# Patient Record
Sex: Male | Born: 1985 | Race: Black or African American | Hispanic: No | Marital: Single | Smoking: Current some day smoker
Health system: Southern US, Community
[De-identification: ages and names within clinical notes are randomized; demographics above are authoritative.]

## PROBLEM LIST (undated history)

## (undated) DIAGNOSIS — R569 Unspecified convulsions: Secondary | ICD-10-CM

## (undated) DIAGNOSIS — I10 Essential (primary) hypertension: Secondary | ICD-10-CM

---

## 2019-10-19 ENCOUNTER — Other Ambulatory Visit: Payer: Self-pay

## 2019-10-19 DIAGNOSIS — Z20822 Contact with and (suspected) exposure to covid-19: Secondary | ICD-10-CM

## 2019-10-22 LAB — NOVEL CORONAVIRUS, NAA: SARS-CoV-2, NAA: NOT DETECTED

## 2019-10-24 ENCOUNTER — Telehealth: Payer: Self-pay

## 2019-10-24 NOTE — Telephone Encounter (Signed)
Patient informed of negative covid result. Patient verbalized understanding.   

## 2020-03-03 ENCOUNTER — Emergency Department
Admission: EM | Admit: 2020-03-03 | Discharge: 2020-03-03 | Disposition: A | Discharge status: Home / Self Care | Payer: Self-pay | Attending: Emergency Medicine | Admitting: Emergency Medicine

## 2020-03-03 ENCOUNTER — Encounter: Payer: Self-pay | Admitting: Emergency Medicine

## 2020-03-03 ENCOUNTER — Other Ambulatory Visit: Payer: Self-pay

## 2020-03-03 DIAGNOSIS — I10 Essential (primary) hypertension: Secondary | ICD-10-CM | POA: Insufficient documentation

## 2020-03-03 DIAGNOSIS — F1310 Sedative, hypnotic or anxiolytic abuse, uncomplicated: Secondary | ICD-10-CM | POA: Insufficient documentation

## 2020-03-03 DIAGNOSIS — F1510 Other stimulant abuse, uncomplicated: Secondary | ICD-10-CM | POA: Insufficient documentation

## 2020-03-03 DIAGNOSIS — F1210 Cannabis abuse, uncomplicated: Secondary | ICD-10-CM | POA: Insufficient documentation

## 2020-03-03 DIAGNOSIS — F17200 Nicotine dependence, unspecified, uncomplicated: Secondary | ICD-10-CM | POA: Insufficient documentation

## 2020-03-03 DIAGNOSIS — F1410 Cocaine abuse, uncomplicated: Secondary | ICD-10-CM | POA: Insufficient documentation

## 2020-03-03 DIAGNOSIS — R569 Unspecified convulsions: Secondary | ICD-10-CM | POA: Insufficient documentation

## 2020-03-03 HISTORY — DX: Unspecified convulsions: R56.9

## 2020-03-03 HISTORY — DX: Essential (primary) hypertension: I10

## 2020-03-03 LAB — CBC
HCT: 42.4 % (ref 39.0–52.0)
Hemoglobin: 14.2 g/dL (ref 13.0–17.0)
MCH: 33.1 pg (ref 26.0–34.0)
MCHC: 33.5 g/dL (ref 30.0–36.0)
MCV: 98.8 fL (ref 80.0–100.0)
Platelets: 186 10*3/uL (ref 150–400)
RBC: 4.29 MIL/uL (ref 4.22–5.81)
RDW: 12.6 % (ref 11.5–15.5)
WBC: 10.4 10*3/uL (ref 4.0–10.5)
nRBC: 0 % (ref 0.0–0.2)

## 2020-03-03 LAB — URINE DRUG SCREEN, QUALITATIVE (ARMC ONLY)
Amphetamines, Ur Screen: POSITIVE — AB
Barbiturates, Ur Screen: NOT DETECTED
Benzodiazepine, Ur Scrn: POSITIVE — AB
Cannabinoid 50 Ng, Ur ~~LOC~~: POSITIVE — AB
Cocaine Metabolite,Ur ~~LOC~~: POSITIVE — AB
MDMA (Ecstasy)Ur Screen: NOT DETECTED
Methadone Scn, Ur: NOT DETECTED
Opiate, Ur Screen: NOT DETECTED
Phencyclidine (PCP) Ur S: NOT DETECTED
Tricyclic, Ur Screen: NOT DETECTED

## 2020-03-03 LAB — COMPREHENSIVE METABOLIC PANEL
ALT: 13 U/L (ref 0–44)
AST: 21 U/L (ref 15–41)
Albumin: 4.1 g/dL (ref 3.5–5.0)
Alkaline Phosphatase: 33 U/L — ABNORMAL LOW (ref 38–126)
Anion gap: 7 (ref 5–15)
BUN: 9 mg/dL (ref 6–20)
CO2: 28 mmol/L (ref 22–32)
Calcium: 9.1 mg/dL (ref 8.9–10.3)
Chloride: 104 mmol/L (ref 98–111)
Creatinine, Ser: 1.24 mg/dL (ref 0.61–1.24)
GFR calc Af Amer: >60 mL/min (ref >60)
GFR calc non Af Amer: >60 mL/min (ref >60)
Glucose, Bld: 109 mg/dL — ABNORMAL HIGH (ref 70–99)
Potassium: 4.2 mmol/L (ref 3.5–5.1)
Sodium: 139 mmol/L (ref 135–145)
Total Bilirubin: 1.1 mg/dL (ref 0.3–1.2)
Total Protein: 7 g/dL (ref 6.5–8.1)

## 2020-03-03 LAB — ETHANOL: Alcohol, Ethyl (B): <10 mg/dL (ref <10)

## 2020-03-03 MED ORDER — LEVETIRACETAM 500 MG PO TABS: 500.0000 mg | ORAL_TABLET | Freq: Two times a day (BID) | ORAL | 1 refills | Status: DC

## 2020-03-03 NOTE — ED Provider Notes (Signed)
[facility] Emergency Department Provider Note  Time seen: 12:06 PM  I have reviewed the triage vital signs and the nursing notes.   HISTORY  Chief Complaint Seizures   HPI Kyle Shelton is a 34 y.o. male with a past medical history of hypertension, seizure disorder, presents emergency department after an apparent seizure at home.   According to the patient has a history of seizures however he states his last seizure was many years ago, was on medication for seizures but states he stopped taking it because he was not having any seizures.  Patient states occasional fairly rare alcohol use maybe 1-2 times per week.  Denies any substance use.  Patient initially postictal per EMS, now is awake alert answering questions appropriately following commands.  Largely negative review of systems.  Past Medical History:  Diagnosis Date  . Hypertension   . Seizures ([place])     There are no problems to display for this patient.   History reviewed. No pertinent surgical history.  Prior to Admission medications   Not on File    No Known Allergies  History reviewed. No pertinent family history.  Social History Social History   Tobacco Use  . Smoking status: Current Some Day Smoker  Substance Use Topics  . Alcohol use: Yes  . Drug use: Yes    Types: Marijuana    Review of Systems Constitutional: Negative for fever. Cardiovascular: Negative for chest pain. Respiratory: Negative for shortness of breath. Gastrointestinal: Negative for abdominal pain Musculoskeletal: Negative for musculoskeletal complaints Neurological: Negative for headache All other ROS negative  ____________________________________________   PHYSICAL EXAM:  VITAL SIGNS: ED Triage Vitals  Enc Vitals Group     BP 03/03/20 1130 (!) 148/89     Pulse Rate 03/03/20 1130 87     Resp 03/03/20 1130 (!) 21     Temp 03/03/20 1130 98 F (36.7 C)     Temp Source 03/03/20 1130 Oral     SpO2  03/03/20 1130 99 %     Weight 03/03/20 1131 210 lb (95.3 kg)     Height 03/03/20 1131 6\' 2"  (1.88 m)     Head Circumference --      Peak Flow --      Pain Score 03/03/20 1131 0     Pain Loc --      Pain Edu? --      Excl. in [place]? --     Constitutional: Alert and oriented. Well appearing and in no distress. Eyes: Normal exam ENT      Head: Normocephalic and atraumatic.      Mouth/Throat: Mucous membranes are moist.  Small right-sided tongue abrasion Cardiovascular: Normal rate, regular rhythm. No murmur Respiratory: Normal respiratory effort without tachypnea nor retractions. Breath sounds are clear  Gastrointestinal: Soft and nontender. No distention.   Musculoskeletal: Nontender with normal range of motion in all extremities.  Neurologic:  Normal speech and language. No gross focal neurologic deficits  Skin:  Skin is warm, dry and intact.  Psychiatric: Mood and affect are normal.   ____________________________________________    EKG  EKG viewed and interpreted by myself shows a normal sinus rhythm at 91 bpm with a narrow QRS, normal axis, normal intervals, no concerning ST changes.    INITIAL IMPRESSION / ASSESSMENT AND PLAN / ED COURSE  Pertinent labs & imaging results that were available during my care of the patient were reviewed by me and considered in my medical decision making (see chart for details).  Patient presents to the emergency department after an apparent seizure at home.  Patient states a history of seizures previously but is not had a seizure for many years, no longer takes his medications.  We will check basic labs, appears largely at his baseline at this time.  Answering questions and following commands.  We will likely place the patient back on antiepileptic medication if his labs look normal.  I would anticipate likely discharge home.  Patient continues to appear well in the emergency department.  Is alert oriented no distress.  Patient's lab work is  reassuring.  We will start the patient on Keppra 500 twice daily have the patient follow-up with neurology.  Patient agreeable to plan of care.  I discussed with the patient avoiding driving.  Also discussed return precautions for any further seizure-like activity.  Kyle Shelton was evaluated in Emergency Department on 03/03/2020 for the symptoms described in the history of present illness. He was evaluated in the context of the global COVID-19 pandemic, which necessitated consideration that the patient might be at risk for infection with the SARS-CoV-2 virus that causes COVID-19. Institutional protocols and algorithms that pertain to the evaluation of patients at risk for COVID-19 are in a state of rapid change based on information released by regulatory bodies including the CDC and federal and state organizations. These policies and algorithms were followed during the patient's care in the ED.  ____________________________________________   FINAL CLINICAL IMPRESSION(S) / ED DIAGNOSES  Seizure   Minna Antis, MD 03/03/20 [phone]

## 2020-03-03 NOTE — ED Notes (Addendum)
EMS called to see if the wallet/money was left in the ambulance. EMS states that he put the money from his pants pocket to his pajama pocket. Pt alerted of this and found his money in his pocket.

## 2020-03-03 NOTE — ED Triage Notes (Signed)
Pt arrival via [facility] from home due to seizure. Per EMS, patient was at home and had a seizure and was post ictal on arrival. With EMS, patient was confused and diaphoretic.   On arrival here, patient is oriented to person, place, and situation but not to time. Pt denies pain at this time.   VS with EMS: -170/94 -89 HR -BS 172 -100% O2

## 2020-03-03 NOTE — ED Notes (Signed)
Pt given pants and shirt that was from EMS. Pt states he's missing a clip with some money in it. This nurse has not seen the clip and patient has been in the room the entire stay since EMS.

## 2020-06-19 ENCOUNTER — Other Ambulatory Visit: Payer: Self-pay

## 2020-06-19 ENCOUNTER — Emergency Department
Admission: EM | Admit: 2020-06-19 | Discharge: 2020-06-19 | Disposition: A | Discharge status: Home / Self Care | Payer: Medicaid Other | Attending: Emergency Medicine | Admitting: Emergency Medicine

## 2020-06-19 DIAGNOSIS — R569 Unspecified convulsions: Secondary | ICD-10-CM | POA: Insufficient documentation

## 2020-06-19 DIAGNOSIS — I10 Essential (primary) hypertension: Secondary | ICD-10-CM | POA: Insufficient documentation

## 2020-06-19 LAB — COMPREHENSIVE METABOLIC PANEL
ALT: 16 U/L (ref 0–44)
AST: 25 U/L (ref 15–41)
Albumin: 5.2 g/dL — ABNORMAL HIGH (ref 3.5–5.0)
Alkaline Phosphatase: 35 U/L — ABNORMAL LOW (ref 38–126)
Anion gap: 11 (ref 5–15)
BUN: 9 mg/dL (ref 6–20)
CO2: 27 mmol/L (ref 22–32)
Calcium: 9.9 mg/dL (ref 8.9–10.3)
Chloride: 100 mmol/L (ref 98–111)
Creatinine, Ser: 1.41 mg/dL — ABNORMAL HIGH (ref 0.61–1.24)
GFR calc Af Amer: >60 mL/min (ref >60)
GFR calc non Af Amer: >60 mL/min (ref >60)
Glucose, Bld: 114 mg/dL — ABNORMAL HIGH (ref 70–99)
Potassium: 4 mmol/L (ref 3.5–5.1)
Sodium: 138 mmol/L (ref 135–145)
Total Bilirubin: 1 mg/dL (ref 0.3–1.2)
Total Protein: 8.9 g/dL — ABNORMAL HIGH (ref 6.5–8.1)

## 2020-06-19 LAB — CBC
HCT: 44 % (ref 39.0–52.0)
Hemoglobin: 15.5 g/dL (ref 13.0–17.0)
MCH: 33.6 pg (ref 26.0–34.0)
MCHC: 35.2 g/dL (ref 30.0–36.0)
MCV: 95.4 fL (ref 80.0–100.0)
Platelets: 221 10*3/uL (ref 150–400)
RBC: 4.61 MIL/uL (ref 4.22–5.81)
RDW: 12.7 % (ref 11.5–15.5)
WBC: 12.7 10*3/uL — ABNORMAL HIGH (ref 4.0–10.5)
nRBC: 0 % (ref 0.0–0.2)

## 2020-06-19 LAB — ETHANOL: Alcohol, Ethyl (B): <10 mg/dL (ref <10)

## 2020-06-19 MED ORDER — LEVETIRACETAM 500 MG PO TABS: 500.0000 mg | ORAL_TABLET | Freq: Two times a day (BID) | ORAL | 1 refills | Status: DC

## 2020-06-19 MED ORDER — LEVETIRACETAM IN NACL 1000 MG/100ML IV SOLN
1000.0000 mg | Freq: Once | INTRAVENOUS | Status: AC
  Administered 2020-06-19: 1000 mg via INTRAVENOUS
  Filled 2020-06-19: qty 100

## 2020-06-19 NOTE — ED Notes (Signed)
E-signature not working at this time. Pt verbalized understanding of D/C instructions, prescriptions and follow up care with no further questions at this time. Pt in NAD and ambulatory at time of D/C.  

## 2020-06-19 NOTE — ED Triage Notes (Signed)
Pt arrives to ED via [facility] from home with c/o a seizure that happened 1.5 hrs PTA. EMS reports pt has a h/x of same; has not taken his r/x'd medications in 2 weeks. EMS states pt was initially  Post-ictal and combative upon their arrival to the residence, but calmed down significantly while en route to the ED. Pt arrives A&O, in NAD; RR even, regular, and unlabored.

## 2020-06-19 NOTE — ED Provider Notes (Signed)
  ER Provider Note       Time seen: 7:08 AM    I have reviewed the vital signs and the nursing notes.  HISTORY   Chief Complaint Seizures    HPI Kyle Shelton is a 34 y.o. male with a history of hypertension, seizures who presents today for seizure.  Patient states he has not had a seizure medications in the last 2 weeks, he still has some at home but not a full prescription.  MS states he was initially postictal and combative upon their arrival but since has calmed significantly.  He denies any complaints.  Past Medical History:  Diagnosis Date  . Hypertension   . Seizures (HCC)     History reviewed. No pertinent surgical history.  Allergies Patient has no known allergies.   Review of Systems Constitutional: Negative for fever. Cardiovascular: Negative for chest pain. Respiratory: Negative for shortness of breath. Gastrointestinal: Negative for abdominal pain, vomiting and diarrhea. Musculoskeletal: Negative for back pain. Skin: Negative for rash. Neurological: Negative for headaches, focal weakness or numbness.  Positive for seizure  All systems negative/normal/unremarkable except as stated in the HPI  ____________________________________________   PHYSICAL EXAM:  VITAL SIGNS: Vitals:   06/19/20 0625 06/19/20 0649  BP: 139/83 139/83  Pulse: 91 92  Resp: 16 16  Temp: 98.5 F (36.9 C)   SpO2: 100% 94%    Constitutional: Alert and oriented. Well appearing and in no distress. Eyes: Conjunctivae are normal. Normal extraocular movements. ENT      Head: Normocephalic and atraumatic.      Nose: No congestion/rhinnorhea.      Mouth/Throat: Mucous membranes are moist.  No obvious laceration      Neck: No stridor. Cardiovascular: Normal rate, regular rhythm. No murmurs, rubs, or gallops. Respiratory: Normal respiratory effort without tachypnea nor retractions. Breath sounds are clear and equal bilaterally. No wheezes/rales/rhonchi. Gastrointestinal: Soft and  nontender. Normal bowel sounds Musculoskeletal: Nontender with normal range of motion in extremities. No lower extremity tenderness nor edema. Neurologic:  Normal speech and language. No gross focal neurologic deficits are appreciated.  Skin:  Skin is warm, dry and intact. No rash noted. Psychiatric: Speech and behavior are normal.   ____________________________________________   LABS (pertinent positives/negatives)  Labs Reviewed  CBC  COMPREHENSIVE METABOLIC PANEL  ETHANOL  URINE DRUG SCREEN, QUALITATIVE ([facility] ONLY)  LEVETIRACETAM LEVEL  CBG MONITORING, ED    DIFFERENTIAL DIAGNOSIS  Seizure, medication noncompliance, intoxication, overdose  ASSESSMENT AND PLAN  Seizure   Plan: The patient had presented for seizure likely secondary to medication noncompliance.  His lab work has been unremarkable, he was loaded with IV Keppra and I rewrote a prescription for his medications.  He is cleared for outpatient follow-up.  Daryel November MD    Note: This note was generated in part or whole with voice recognition software. Voice recognition is usually quite accurate but there are transcription errors that can and very often do occur. I apologize for any typographical errors that were not detected and corrected.     Emily Filbert, MD 06/19/20 [phone]

## 2020-06-19 NOTE — ED Notes (Signed)
Pt still awaiting ride home at this time. Pt in NAD and resting comfortably.

## 2020-06-22 LAB — LEVETIRACETAM LEVEL: Levetiracetam Lvl: <1 ug/mL — ABNORMAL LOW (ref 10.0–40.0)

## 2020-08-09 ENCOUNTER — Emergency Department
Admission: EM | Admit: 2020-08-09 | Discharge: 2020-08-09 | Disposition: A | Discharge status: Home / Self Care | Payer: Medicaid Other | Attending: Emergency Medicine | Admitting: Emergency Medicine

## 2020-08-09 ENCOUNTER — Other Ambulatory Visit: Payer: Self-pay

## 2020-08-09 ENCOUNTER — Emergency Department: Payer: Medicaid Other

## 2020-08-09 DIAGNOSIS — F17200 Nicotine dependence, unspecified, uncomplicated: Secondary | ICD-10-CM | POA: Insufficient documentation

## 2020-08-09 DIAGNOSIS — Z79899 Other long term (current) drug therapy: Secondary | ICD-10-CM | POA: Insufficient documentation

## 2020-08-09 DIAGNOSIS — R569 Unspecified convulsions: Secondary | ICD-10-CM

## 2020-08-09 DIAGNOSIS — G40909 Epilepsy, unspecified, not intractable, without status epilepticus: Secondary | ICD-10-CM | POA: Insufficient documentation

## 2020-08-09 DIAGNOSIS — I10 Essential (primary) hypertension: Secondary | ICD-10-CM | POA: Insufficient documentation

## 2020-08-09 LAB — CBC WITH DIFFERENTIAL/PLATELET
Abs Immature Granulocytes: 0.03 10*3/uL (ref 0.00–0.07)
Basophils Absolute: 0 10*3/uL (ref 0.0–0.1)
Basophils Relative: 0 %
Eosinophils Absolute: 0.1 10*3/uL (ref 0.0–0.5)
Eosinophils Relative: 1 %
HCT: 41.6 % (ref 39.0–52.0)
Hemoglobin: 13.6 g/dL (ref 13.0–17.0)
Immature Granulocytes: 0 %
Lymphocytes Relative: 27 %
Lymphs Abs: 3 10*3/uL (ref 0.7–4.0)
MCH: 33.3 pg (ref 26.0–34.0)
MCHC: 32.7 g/dL (ref 30.0–36.0)
MCV: 101.7 fL — ABNORMAL HIGH (ref 80.0–100.0)
Monocytes Absolute: 0.7 10*3/uL (ref 0.1–1.0)
Monocytes Relative: 6 %
Neutro Abs: 7.2 10*3/uL (ref 1.7–7.7)
Neutrophils Relative %: 66 %
Platelets: 187 10*3/uL (ref 150–400)
RBC: 4.09 MIL/uL — ABNORMAL LOW (ref 4.22–5.81)
RDW: 12.4 % (ref 11.5–15.5)
WBC: 11.1 10*3/uL — ABNORMAL HIGH (ref 4.0–10.5)
nRBC: 0 % (ref 0.0–0.2)

## 2020-08-09 LAB — BASIC METABOLIC PANEL
Anion gap: 16 — ABNORMAL HIGH (ref 5–15)
BUN: 10 mg/dL (ref 6–20)
CO2: 20 mmol/L — ABNORMAL LOW (ref 22–32)
Calcium: 8.6 mg/dL — ABNORMAL LOW (ref 8.9–10.3)
Chloride: 106 mmol/L (ref 98–111)
Creatinine, Ser: 1.49 mg/dL — ABNORMAL HIGH (ref 0.61–1.24)
GFR calc Af Amer: >60 mL/min (ref >60)
GFR calc non Af Amer: 60 mL/min — ABNORMAL LOW (ref >60)
Glucose, Bld: 123 mg/dL — ABNORMAL HIGH (ref 70–99)
Potassium: 3.4 mmol/L — ABNORMAL LOW (ref 3.5–5.1)
Sodium: 142 mmol/L (ref 135–145)

## 2020-08-09 MED ORDER — LEVETIRACETAM 500 MG PO TABS: 500.0000 mg | ORAL_TABLET | Freq: Two times a day (BID) | ORAL | 1 refills | Status: AC

## 2020-08-09 MED ORDER — LORAZEPAM 2 MG/ML IJ SOLN
1.0000 mg | Freq: Once | INTRAMUSCULAR | Status: AC
  Administered 2020-08-09: 1 mg via INTRAVENOUS
  Filled 2020-08-09: qty 1

## 2020-08-09 MED ORDER — LEVETIRACETAM IN NACL 1000 MG/100ML IV SOLN
1000.0000 mg | Freq: Once | INTRAVENOUS | Status: AC
  Administered 2020-08-09: 1000 mg via INTRAVENOUS
  Filled 2020-08-09: qty 100

## 2020-08-09 MED ORDER — SODIUM CHLORIDE 0.9 % IV BOLUS
1000.0000 mL | Freq: Once | INTRAVENOUS | Status: AC
  Administered 2020-08-09: 1000 mL via INTRAVENOUS

## 2020-08-09 NOTE — ED Notes (Signed)
Pt given water, bed adjusted and lights dimmed for pt comfort

## 2020-08-09 NOTE — ED Provider Notes (Signed)
[facility] Emergency Department Provider Note   ____________________________________________   First MD Initiated Contact with Patient 08/09/20 [phone]     (approximate)  I have reviewed the triage vital signs and the nursing notes.   HISTORY  Chief Complaint Seizure   HPI Kyle Shelton is a 34 y.o. 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  Did not bite tongue or suffer urinary incontinence.  Patient has a history of seizure disorder noncompliant on Keppra.  States he did start taking it 2 weeks ago but does not take it daily as prescribed.  Reportedly he was speaking with another security guard and had a tonic-clonic seizure lasting less than 5 minutes.  EMS reports postictal state upon their arrival.  Patient is back to baseline upon arrival to the ED.  Denies recent illnesses such as fever, cough, chest pain, shortness of breath, abdominal pain, nausea or vomiting.  Denies drugs or alcohol use.      Past Medical History:  Diagnosis Date  . Hypertension   . Seizures (HCC)     There are no problems to display for this patient.   History reviewed. No pertinent surgical history.  Prior to Admission medications   Medication Sig Start Date End Date Taking? Authorizing Provider  levETIRAcetam (KEPPRA) 500 MG tablet Take 1 tablet (500 mg total) by mouth 2 (two) times daily. 06/19/20   Emily Filbert, MD    Allergies Patient has no known allergies.  History reviewed. No pertinent family history.  Social History Social History   Tobacco Use  . Smoking status: Current Some Day Smoker  . Smokeless tobacco: Never Used  Vaping Use  . Vaping Use: Some days  Substance Use Topics  . Alcohol use: Yes  . Drug use: Yes    Types: Marijuana    Review of Systems  Constitutional: No fever/chills Eyes: No visual changes. ENT: No sore throat. Cardiovascular: Denies chest pain. Respiratory: Denies shortness of  breath. Gastrointestinal: No abdominal pain.  No nausea, no vomiting.  No diarrhea.  No constipation. Genitourinary: Negative for dysuria. Musculoskeletal: Negative for back pain. Skin: Negative for rash. Neurological: Positive for seizure.  Negative for headaches, focal weakness or numbness.   ____________________________________________   PHYSICAL EXAM:  VITAL SIGNS: ED Triage Vitals  Enc Vitals Group     BP      Pulse      Resp      Temp      Temp src      SpO2      Weight      Height      Head Circumference      Peak Flow      Pain Score      Pain Loc      Pain Edu?      Excl. in [place]?     Constitutional: Alert and oriented. Well appearing and in no acute distress. Eyes: Conjunctivae are normal. PERRL. EOMI. Head: Atraumatic.  Trace blood overlying patient's dreadlocks but upon close examination of scalp, there is no scalp laceration noted. Nose: No congestion/rhinnorhea. Mouth/Throat: Mucous membranes are moist.  No tongue laceration. Neck: No stridor.  No cervical spine tenderness to palpation. Cardiovascular: Normal rate, regular rhythm. Grossly normal heart sounds.  Good peripheral circulation. Respiratory: Normal respiratory effort.  No retractions. Lungs CTAB. Gastrointestinal: Soft and nontender. No distention. No abdominal bruits. No CVA tenderness. Musculoskeletal: No lower extremity tenderness nor edema.  No joint effusions. Neurologic: Alert and oriented x3.  CN II to XII grossly intact. Normal speech and language. No gross focal neurologic deficits are appreciated.  Skin:  Skin is warm, dry and intact. No rash noted.  No petechiae. Psychiatric: Mood and affect are normal. Speech and behavior are normal.  ____________________________________________   LABS (all labs ordered are listed, but only abnormal results are displayed)  Labs Reviewed  CBC WITH DIFFERENTIAL/PLATELET - Abnormal; Notable for the following components:      Result Value   WBC 11.1  (*)    RBC 4.09 (*)    MCV 101.7 (*)    All other components within normal limits  BASIC METABOLIC PANEL - Abnormal; Notable for the following components:   Potassium 3.4 (*)    CO2 20 (*)    Glucose, Bld 123 (*)    Creatinine, Ser 1.49 (*)    Calcium 8.6 (*)    GFR calc non Af Amer 60 (*)    Anion gap 16 (*)    All other components within normal limits  LEVETIRACETAM LEVEL   ____________________________________________  EKG  ED ECG REPORT I, Bronsen Serano J, the attending physician, personally viewed and interpreted this ECG.   Date: 08/09/2020  EKG Time: 0053  Rate: 75  Rhythm: normal EKG, normal sinus rhythm  Axis: RAD  Intervals:none  ST&T Change: Nonspecific  ____________________________________________  RADIOLOGY  ED MD interpretation: No ICH  Official radiology report(s): CT Head Wo Contrast  Result Date: 08/09/2020 CLINICAL DATA:  Witnessed seizure EXAM: CT HEAD WITHOUT CONTRAST TECHNIQUE: Contiguous axial images were obtained from the base of the skull through the vertex without intravenous contrast. COMPARISON:  None. FINDINGS: Brain: No evidence of acute territorial infarction, hemorrhage, hydrocephalus,extra-axial collection or mass lesion/mass effect. Normal gray-white differentiation. Ventricles are normal in size and contour. Vascular: No hyperdense vessel or unexpected calcification. Skull: The skull is intact. No fracture or focal lesion identified. Sinuses/Orbits: The visualized paranasal sinuses and mastoid air cells are clear. The orbits and globes intact. Other: None IMPRESSION: No acute intracranial abnormality. Electronically Signed   By: Jonna Clark M.D.   On: 08/09/2020 01:46    ____________________________________________   PROCEDURES  Procedure(s) performed (including Critical Care):  Procedures   ____________________________________________   INITIAL IMPRESSION / ASSESSMENT AND PLAN / ED COURSE  As part of my medical decision making, I  reviewed the following data within the electronic MEDICAL RECORD NUMBER Nursing notes reviewed and incorporated, Labs reviewed, EKG interpreted, Old chart reviewed, Radiograph reviewed and Notes from prior ED visits     Lucus Lambertson was evaluated in Emergency Department on 08/09/2020 for the symptoms described in the history of present illness. He was evaluated in the context of the global COVID-19 pandemic, which necessitated consideration that the patient might be at risk for infection with the SARS-CoV-2 virus that causes COVID-19. Institutional protocols and algorithms that pertain to the evaluation of patients at risk for COVID-19 are in a state of rapid change based on information released by regulatory bodies including the CDC and federal and state organizations. These policies and algorithms were followed during the patient's care in the ED.    34 year old male with seizure disorder presenting with seizure; history of medical noncompliance.  Differential diagnosis includes but is not limited to ICH, subtherapeutic Keppra level, toxicological, infectious, metabolic etiologies, etc.  Will obtain lab work, CT head.  Initiate IV fluid resuscitation.  Administer IV Keppra load, IV Ativan and reassess.   Clinical Course as of  Aug 10 243  Sat Aug 09, 2020  6222 Patient awake, feeling better.  Asking for food and drink.  Updated him on laboratory and CT imaging results.  Did note very minimal elevation of anion gap which is attributable to seizure activity; no DKA suspected.  Will refill patient's Keppra and refer him to neurology for outpatient follow-up.  Strict return precautions given.  Patient verbalizes understanding agrees with plan of care.   [JS]    Clinical Course User Index [JS] Irean Hong, MD     ____________________________________________   FINAL CLINICAL IMPRESSION(S) / ED DIAGNOSES  Final diagnoses:  Seizure [facility])     ED Discharge Orders    None       Note:  This  document was prepared using Dragon voice recognition software and may include unintentional dictation errors.   Irean Hong, MD 08/09/20 [phone]

## 2020-08-09 NOTE — ED Triage Notes (Signed)
PT to ED via EMS from work. PT was at work and his coworker looked back and pt was falling, coworker caught and assisted him to the ground and pt had witnessed seizure lasting approx 5 mins. PT was postictal aferwards, pt alert upon arrival, oriented to self and location. HX of seizures, only takes medicine occaisonally.   cbg 115 144/78 96% ra 100 HR

## 2020-08-09 NOTE — Discharge Instructions (Signed)
1.  Take your seizure medicine every day as directed (Keppra 500 mg twice daily). 2.  Do not drive or operate heavy machinery until cleared by the neurologist. 3.  Return to the ER for worsening symptoms, persistent vomiting, difficulty breathing or other concerns.

## 2020-08-09 NOTE — ED Notes (Signed)
Educated pt on importance of taking medications as prescribed

## 2020-08-14 LAB — LEVETIRACETAM LEVEL: Levetiracetam Lvl: <1 ug/mL — ABNORMAL LOW (ref 10.0–40.0)

## 2020-12-09 ENCOUNTER — Emergency Department
Admission: EM | Admit: 2020-12-09 | Discharge: 2020-12-09 | Disposition: A | Discharge status: Home / Self Care | Payer: Medicaid Other | Attending: Emergency Medicine | Admitting: Emergency Medicine

## 2020-12-09 ENCOUNTER — Other Ambulatory Visit: Payer: Self-pay

## 2020-12-09 DIAGNOSIS — F17200 Nicotine dependence, unspecified, uncomplicated: Secondary | ICD-10-CM | POA: Insufficient documentation

## 2020-12-09 DIAGNOSIS — R569 Unspecified convulsions: Secondary | ICD-10-CM

## 2020-12-09 DIAGNOSIS — I10 Essential (primary) hypertension: Secondary | ICD-10-CM | POA: Insufficient documentation

## 2020-12-09 DIAGNOSIS — F1590 Other stimulant use, unspecified, uncomplicated: Secondary | ICD-10-CM | POA: Insufficient documentation

## 2020-12-09 MED ORDER — LEVETIRACETAM IN NACL 1500 MG/100ML IV SOLN
1500.0000 mg | Freq: Once | INTRAVENOUS | Status: AC
  Administered 2020-12-09: 1500 mg via INTRAVENOUS
  Filled 2020-12-09: qty 100

## 2020-12-09 NOTE — Discharge Instructions (Addendum)
Please follow-up with your seizure doctor.  Please return as needed.  Don't forget to take your Keppra twice a day.  Not run out of it.

## 2020-12-09 NOTE — ED Triage Notes (Signed)
Pt presents to ER after pt had a seizure at work.  Pt was standing up and working, and suddenly dropped and started seizing per his coworkers.  Seizure lasted appx 30 seconds, and pt was post-ictal afterwards. Pt has hx of seizures, and takes keppra.  Pt does not always take meds every day.  Pt A&O x4 at this time.  Pt does have bite mark to tongue.

## 2020-12-09 NOTE — ED Provider Notes (Signed)
[facility] Emergency Department Provider Note   ____________________________________________   Event Date/Time   First MD Initiated Contact with Patient 12/09/20 2027     (approximate)  I have reviewed the triage vital signs and the nursing notes.   HISTORY  Chief Complaint Seizures    HPI Kyle Shelton is a 35 y.o. male patient with a history of seizures and supposed to take Keppra twice a day but doesn't.  He only takes Keppra 3 or 4 times a week.  He was standing at work today and fell down and had a seizure per his coworkers.  He remembers he was at work and remembers waking up with EMS standing over him.  Reportedly his seizure was about 30 seconds long and was tonic-clonic grand mal.  Patient is now back to baseline and provides me the history as above.  He bit his tongue but otherwise has no injuries.  He tells me he now understands the need to take his Keppra as prescribed.         Past Medical History:  Diagnosis Date  . Hypertension   . Seizures (HCC)     There are no problems to display for this patient.   History reviewed. No pertinent surgical history.  Prior to Admission medications   Medication Sig Start Date End Date Taking? Authorizing Provider  famotidine (PEPCID) 40 MG tablet Take by mouth. 05/15/19  Yes [provider]  levETIRAcetam (KEPPRA) 500 MG tablet Take 1 tablet (500 mg total) by mouth 2 (two) times daily. 08/09/20   Irean Hong, MD    Allergies Patient has no known allergies.  History reviewed. No pertinent family history.  Social History Social History   Tobacco Use  . Smoking status: Current Some Day Smoker  . Smokeless tobacco: Never Used  Vaping Use  . Vaping Use: Some days  Substance Use Topics  . Alcohol use: Yes  . Drug use: Yes    Types: Marijuana    Review of Systems  Constitutional: No fever/chills Eyes: No visual changes. ENT: No sore throat. Cardiovascular: Denies chest  pain. Respiratory: Denies shortness of breath. Gastrointestinal: No abdominal pain.  No nausea, no vomiting.  No diarrhea.  No constipation. Genitourinary: Negative for dysuria. Musculoskeletal: Negative for back pain. Skin: Negative for rash. Neurological: Negative for headaches, focal weakness   ____________________________________________   PHYSICAL EXAM:  VITAL SIGNS: ED Triage Vitals [12/09/20 2036]  Enc Vitals Group     BP (!) 143/83     Pulse Rate 77     Resp 19     Temp 98.3 F (36.8 C)     Temp Source Oral     SpO2 98 %     Weight 212 lb (96.2 kg)     Height 6\' 1"  (1.854 m)     Head Circumference      Peak Flow      Pain Score 0     Pain Loc      Pain Edu?      Excl. in GC?     Constitutional: Alert and oriented. Well appearing and in no acute distress. Eyes: Conjunctivae are normal. PERRL. EOMI. Head: Atraumatic. Nose: No congestion/rhinnorhea. Mouth/Throat: Mucous membranes are moist.  Oropharynx non-erythematous.  Patient has a superficial bite injury to the right side of his tongue Neck: No stridor. Cardiovascular: Normal rate, regular rhythm. Grossly normal heart sounds.  Good peripheral circulation. Respiratory: Normal respiratory effort.  No retractions. Lungs CTAB. Gastrointestinal: Soft and  nontender. No distention. No abdominal bruits.  Musculoskeletal: No lower extremity tenderness nor edema.   Neurologic:  Normal speech and language. No gross focal neurologic deficits are appreciated.  Cranial nerves II through XII are intact although visual fields were not checked motor strength is 5/5 throughout cerebellar finger-nose heel-to-shin and rapid alternating movements in the hands are normal patient does not report any numbness Skin:  Skin is warm, dry and intact. No rash noted.   ____________________________________________   LABS (all labs ordered are listed, but only abnormal results are displayed)  Labs Reviewed - No data to  display ____________________________________________  EKG   ____________________________________________  RADIOLOGY Jill Poling, personally viewed and evaluated these images (plain radiographs) as part of my medical decision making, as well as reviewing the written report by the radiologist.  ED MD interpretation:   Official radiology report(s): No results found.  ____________________________________________   PROCEDURES  Procedure(s) performed (including Critical Care):  Procedures   ____________________________________________   INITIAL IMPRESSION / ASSESSMENT AND PLAN / ED COURSE  I explained to the patient and the fact that he takes his Keppra sometimes and not others actually increases his risk of seizures.  He will attempt to take his Keppra regularly and follow-up with his doctor.  I explained to him that after a period of time with no seizures she can resume driving and also can be tapered off the Keppra and possibly not have to take it anymore.  I also discussed with him the possibility of epilepsy surgery if he meets the criteria.              ____________________________________________   FINAL CLINICAL IMPRESSION(S) / ED DIAGNOSES  Final diagnoses:  Seizure [facility])     ED Discharge Orders    None      *Please note:  Kyle Shelton was evaluated in Emergency Department on 12/09/2020 for the symptoms described in the history of present illness. He was evaluated in the context of the global COVID-19 pandemic, which necessitated consideration that the patient might be at risk for infection with the SARS-CoV-2 virus that causes COVID-19. Institutional protocols and algorithms that pertain to the evaluation of patients at risk for COVID-19 are in a state of rapid change based on information released by regulatory bodies including the CDC and federal and state organizations. These policies and algorithms were followed during the patient's care in the ED.   Some ED evaluations and interventions may be delayed as a result of limited staffing during and the pandemic.*   Note:  This document was prepared using Dragon voice recognition software and may include unintentional dictation errors.    Arnaldo Natal, MD 12/09/20 2129

## 2021-05-03 IMAGING — CT CT HEAD W/O CM
3 series · 16 of 47 positions shown, 19 images · non-contrast
Comparison: None.

CLINICAL DATA: Witnessed seizure

EXAM:
CT HEAD WITHOUT CONTRAST
TECHNIQUE: Contiguous axial images were obtained from the base of the skull
through the vertex without intravenous contrast.

[Series 2: head wo · axial · 0.41mm/px · z∈[+367,+492]mm · 10 of 30 slices shown, 13 images]
[im 3/30  brain]
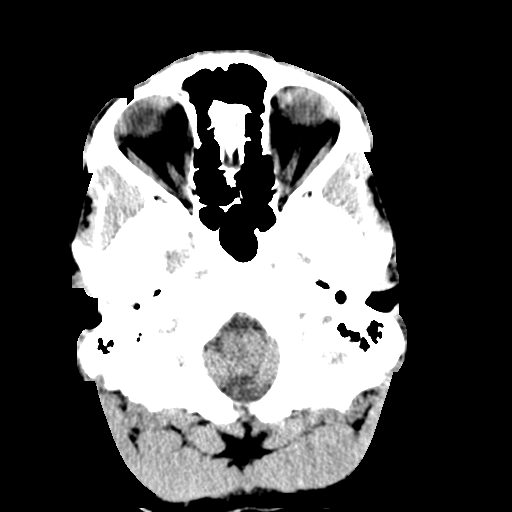
[im 3/30  bone]
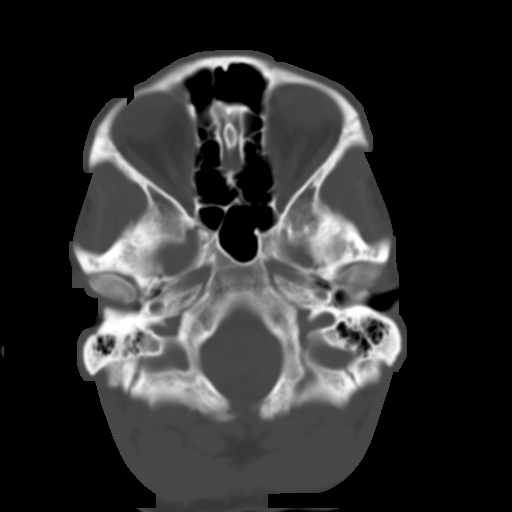
[im 6/30  brain]
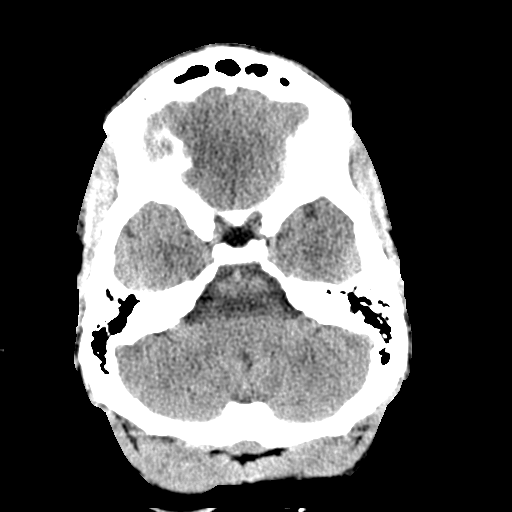
[im 9/30  brain]
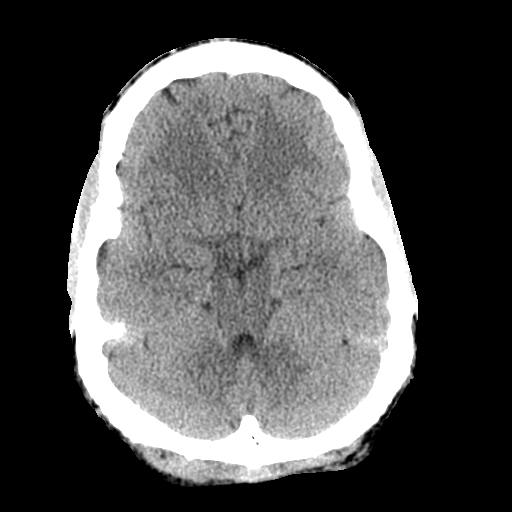
[im 11/30  brain]
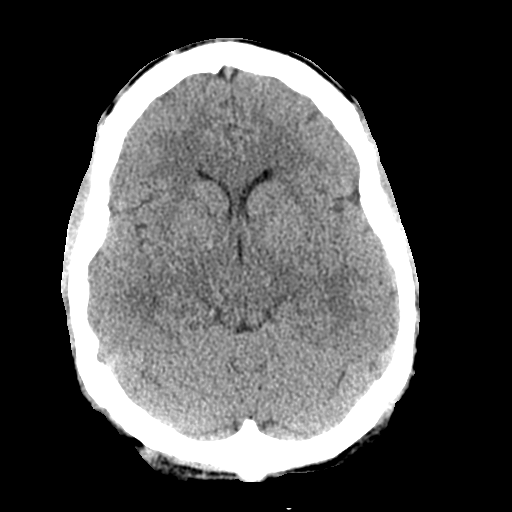
[im 14/30  brain]
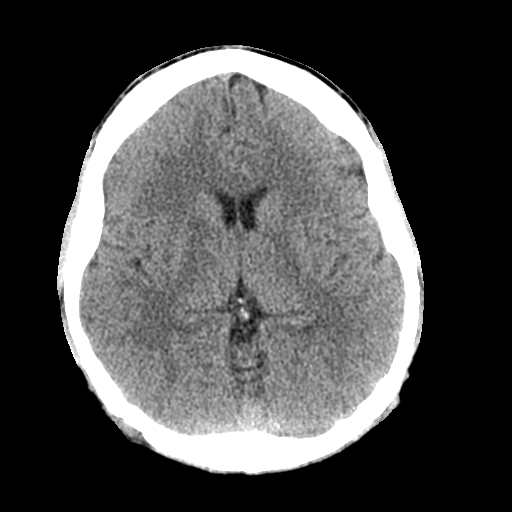
[im 14/30  bone]
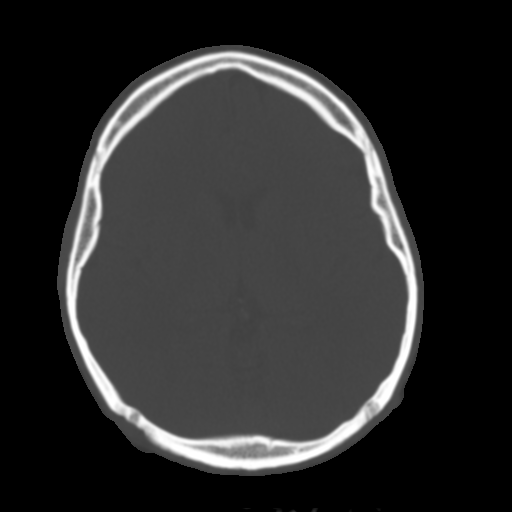
[im 17/30  brain]
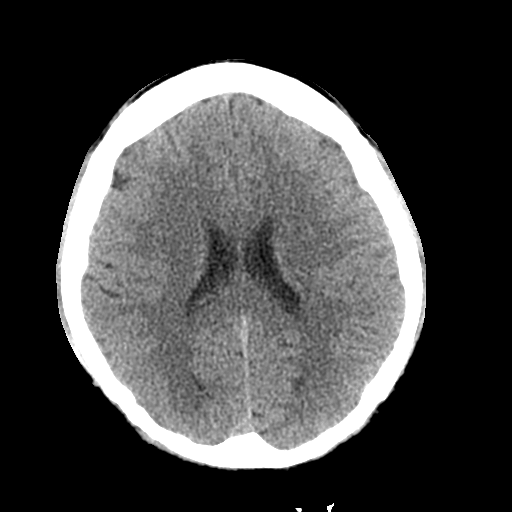
[im 20/30  brain]
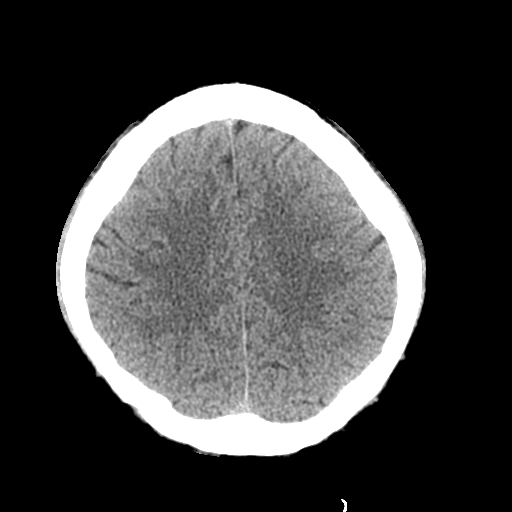
[im 23/30  brain]
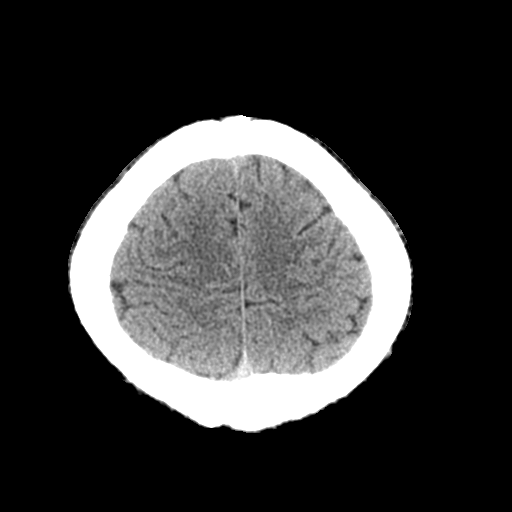
[im 25/30  brain]
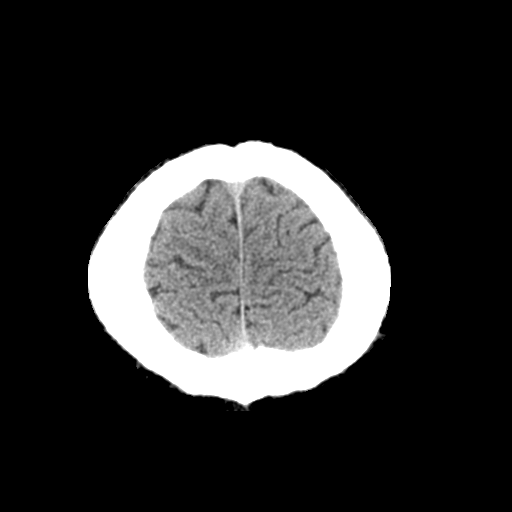
[im 25/30  bone]
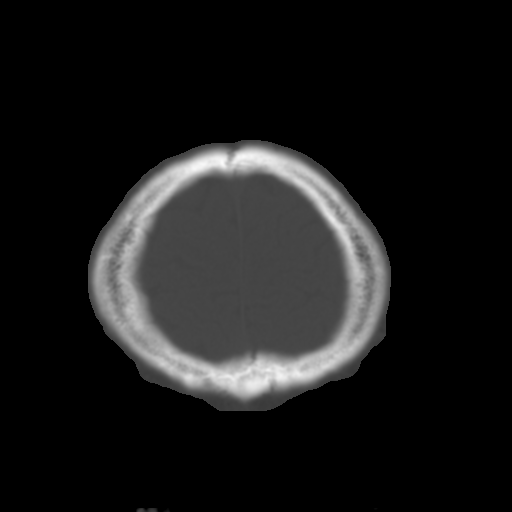
[im 28/30  brain]
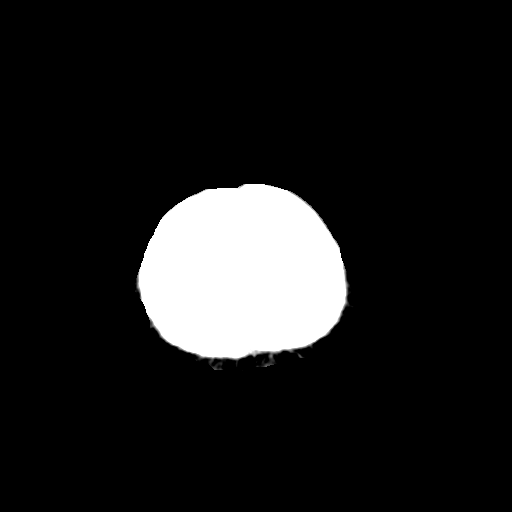

[Series 4: coronal soft tissue · coronal · 0.32mm/px · 3 of 59 slices shown]
[im 20/59  brain]
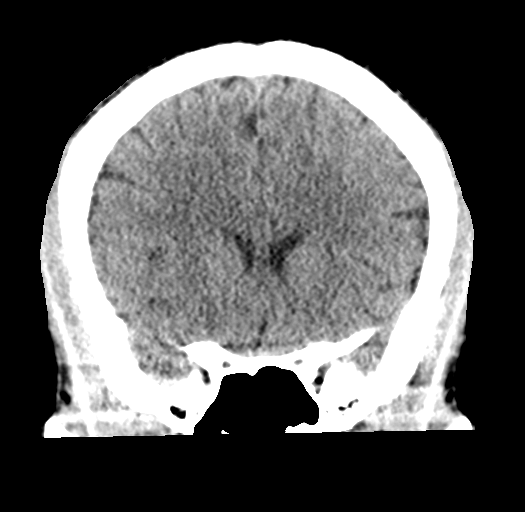
[im 26/59  brain]
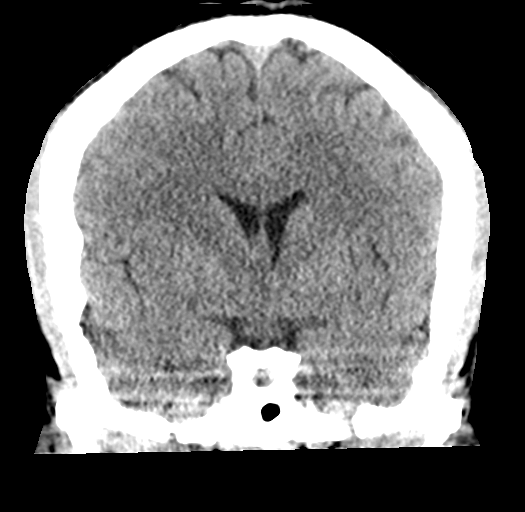
[im 33/59  brain]
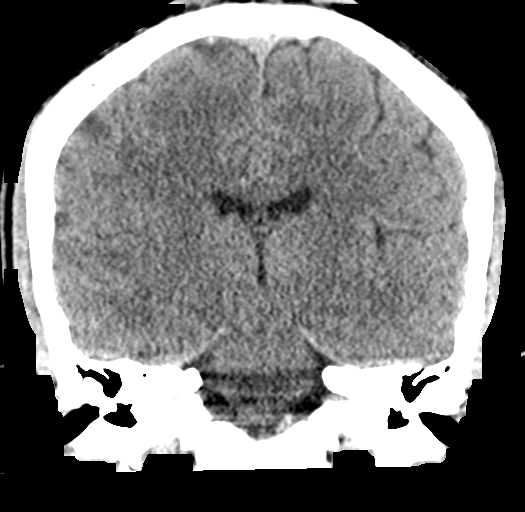

[Series 5: sagittal soft tissue · sagittal · 0.34mm/px · 3 of 52 slices shown]
[im 18/52  brain]
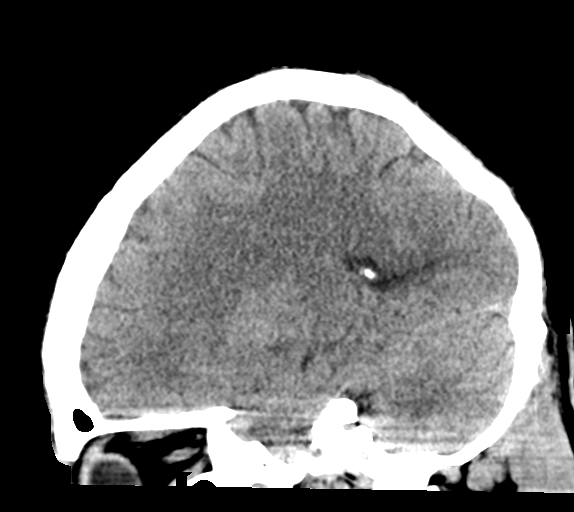
[im 26/52  brain]
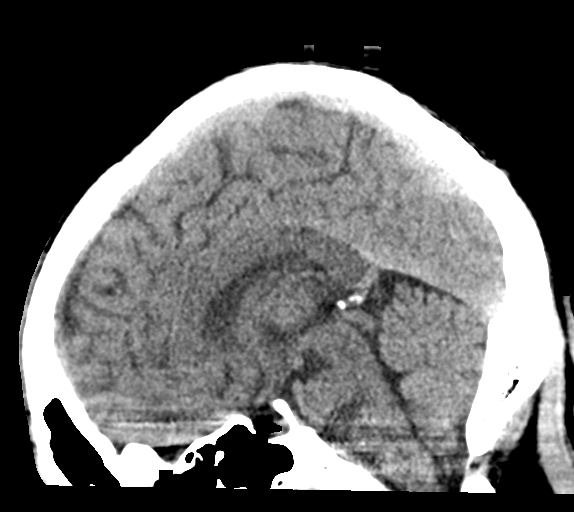
[im 35/52  brain]
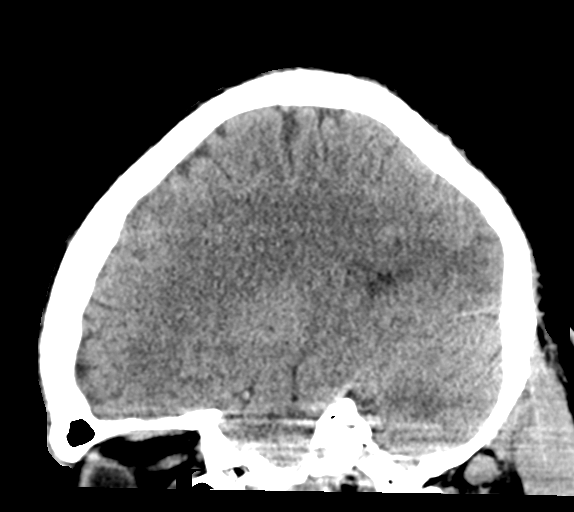

[16 of 47 positions shown; findings below may reference images not displayed]

FINDINGS: Brain: No evidence of acute territorial infarction, hemorrhage,
hydrocephalus,extra-axial collection or mass lesion/mass effect.
Normal gray-white differentiation. Ventricles are normal in size and
contour.

Vascular: No hyperdense vessel or unexpected calcification.

Skull: The skull is intact. No fracture or focal lesion identified.

Sinuses/Orbits: The visualized paranasal sinuses and mastoid air
cells are clear. The orbits and globes intact.

Other: None
IMPRESSION: No acute intracranial abnormality.
